# Patient Record
Sex: Male | Born: 1970 | Marital: Married
Health system: Southern US, Community
[De-identification: ages and names within clinical notes are randomized; demographics above are authoritative.]

---

## 2017-10-23 ENCOUNTER — Other Ambulatory Visit: Payer: Self-pay | Admitting: Family Medicine

## 2017-10-23 DIAGNOSIS — K824 Cholesterolosis of gallbladder: Secondary | ICD-10-CM

## 2017-10-23 DIAGNOSIS — B182 Chronic viral hepatitis C: Secondary | ICD-10-CM

## 2017-10-30 ENCOUNTER — Ambulatory Visit
Admission: RE | Admit: 2017-10-30 | Discharge: 2017-10-30 | Disposition: A | Discharge status: Home / Self Care | Payer: 59 | Source: Ambulatory Visit | Attending: Family Medicine | Admitting: Family Medicine

## 2017-10-30 DIAGNOSIS — K824 Cholesterolosis of gallbladder: Secondary | ICD-10-CM

## 2017-10-30 DIAGNOSIS — B182 Chronic viral hepatitis C: Secondary | ICD-10-CM

## 2017-11-27 ENCOUNTER — Other Ambulatory Visit: Payer: Self-pay | Admitting: Gastroenterology

## 2017-11-27 DIAGNOSIS — B182 Chronic viral hepatitis C: Secondary | ICD-10-CM

## 2017-12-03 ENCOUNTER — Ambulatory Visit (HOSPITAL_COMMUNITY)
Admission: RE | Admit: 2017-12-03 | Discharge: 2017-12-03 | Disposition: A | Discharge status: Home / Self Care | Payer: 59 | Source: Ambulatory Visit | Attending: Gastroenterology | Admitting: Gastroenterology

## 2017-12-03 DIAGNOSIS — B182 Chronic viral hepatitis C: Secondary | ICD-10-CM | POA: Diagnosis present

## 2020-03-01 IMAGING — US US ABDOMEN COMPLETE
1 series · 13 of 25 positions shown · non-contrast
Comparison: None

CLINICAL DATA: Chronic hepatitis-C without hepatic coma

EXAM:
ABDOMEN ULTRASOUND COMPLETE

[Series 1: us abdomen complete · 0.12mm/px · 13 of 96 slices shown]
[im 1/96]
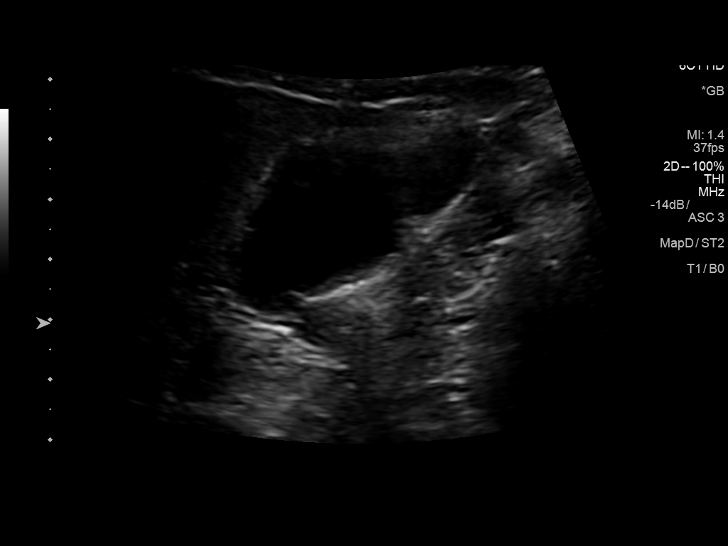
[im 8/96]
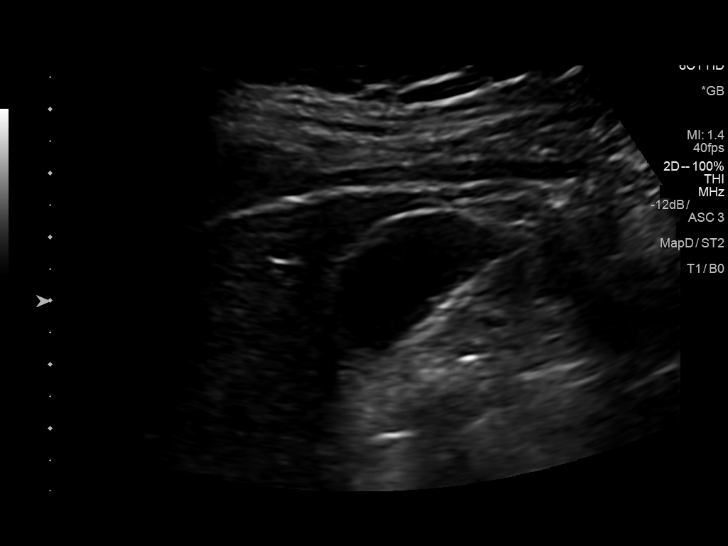
[im 16/96]
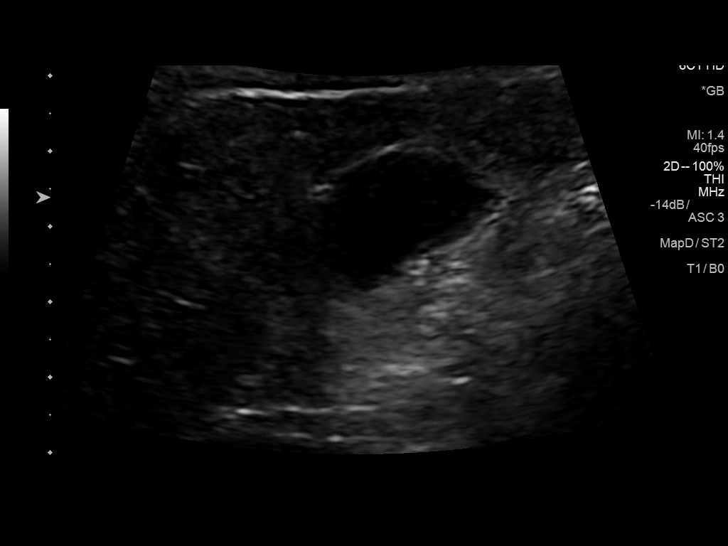
[im 24/96]
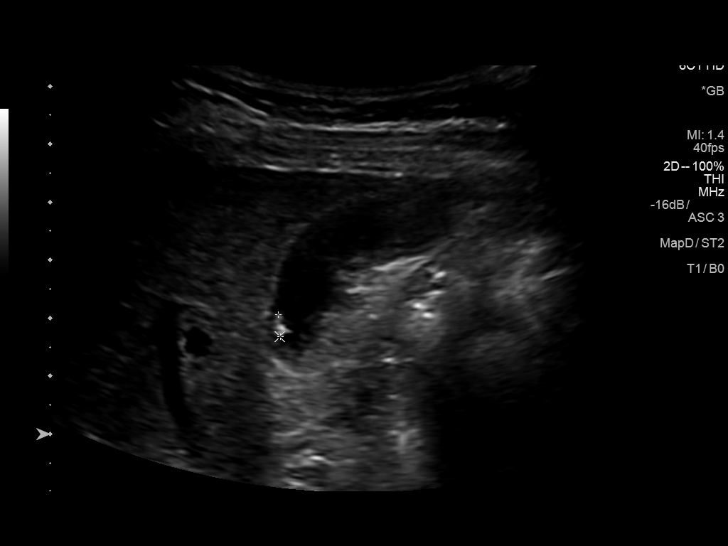
[im 32/96]
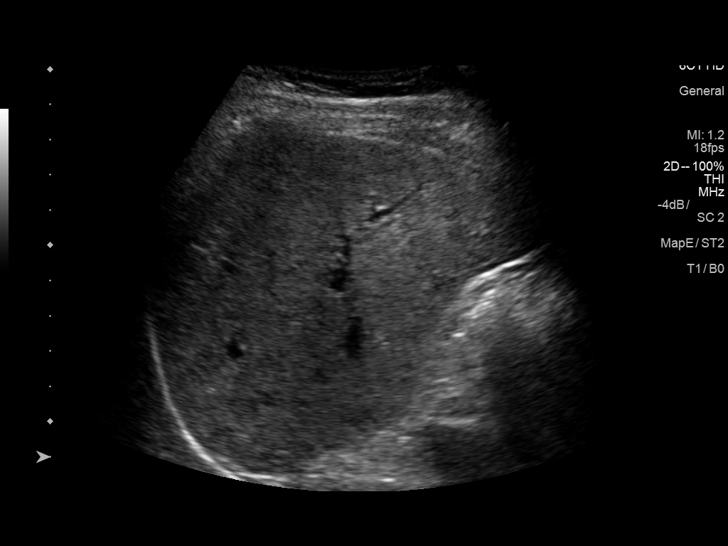
[im 40/96]
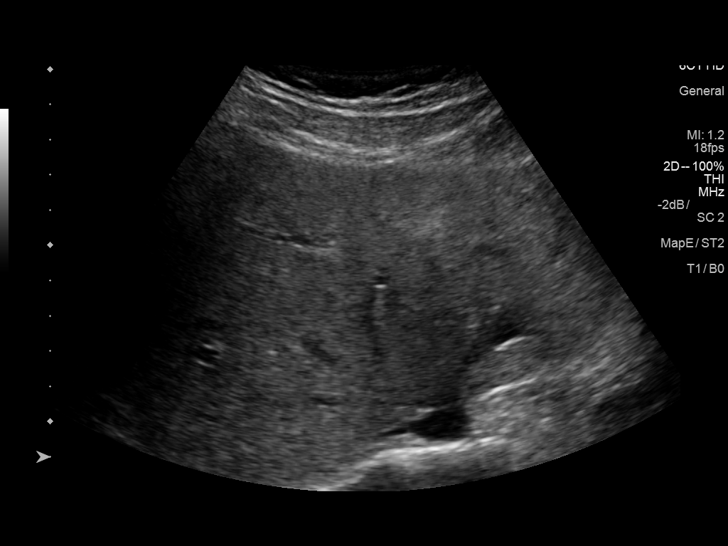
[im 48/96]
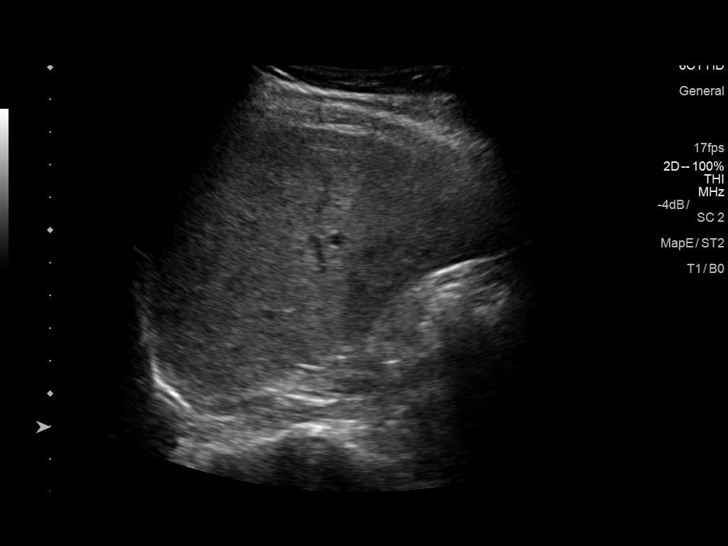
[im 56/96]
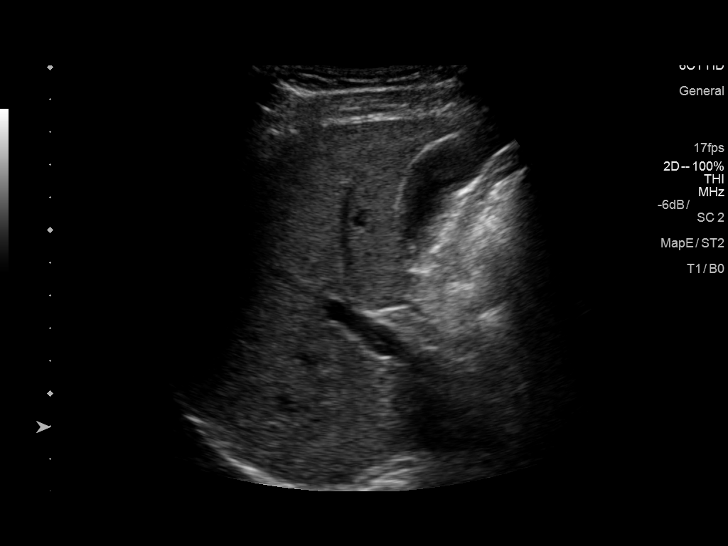
[im 64/96]
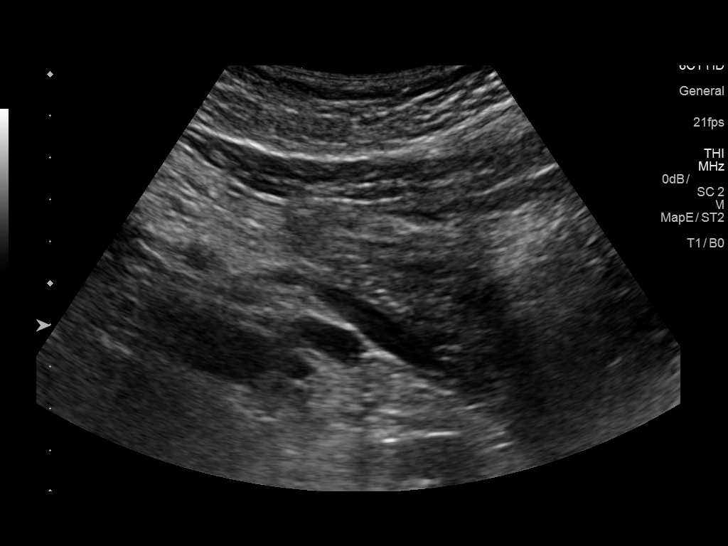
[im 72/96]
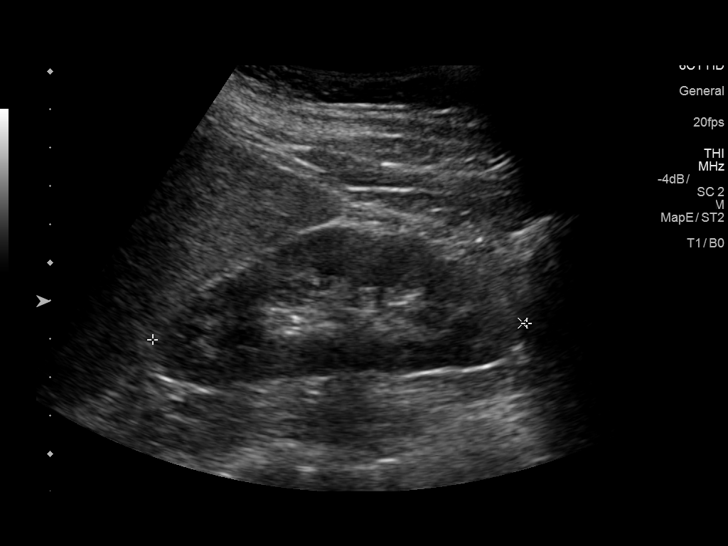
[im 80/96]
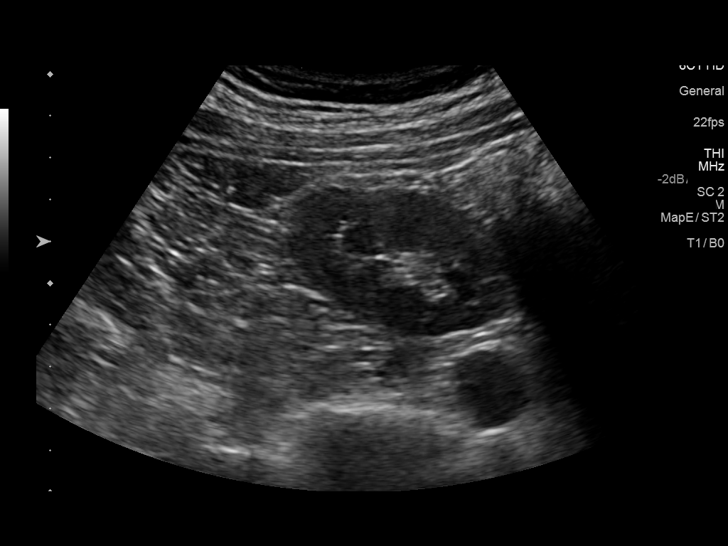
[im 88/96]
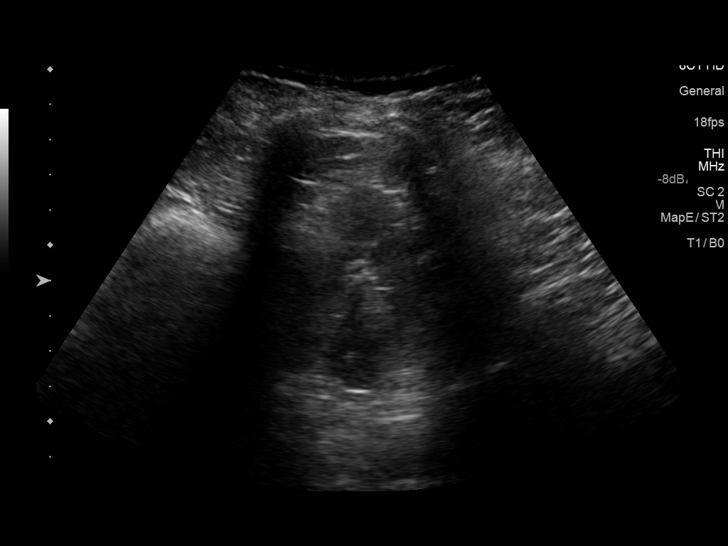
[im 96/96]
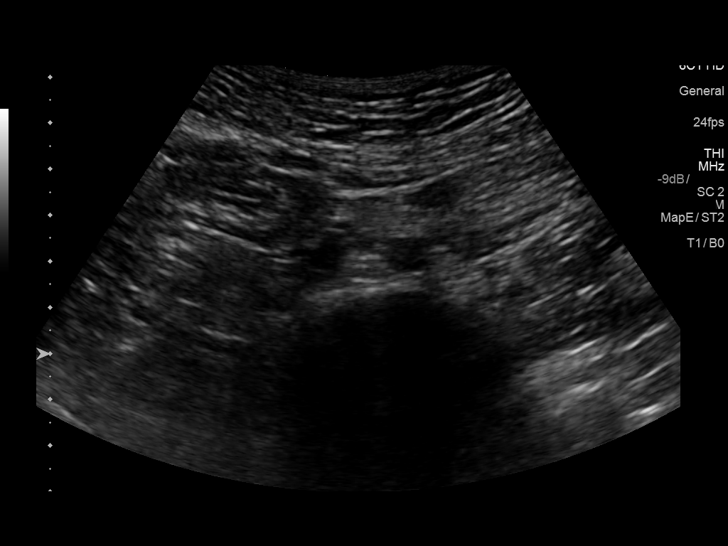

[13 of 25 positions shown; findings below may reference images not displayed]

FINDINGS: Gallbladder: 7 mm non mobile echogenic focus within gallbladder
lumen at lower gallbladder segment question polyp. Additional
questionable polyp 4 mm diameter. No gallbladder wall thickening,
pericholecystic fluid or sonographic Murphy sign.

Common bile duct: Diameter: Normal caliber 4 mm diameter

Liver: Echogenic parenchyma, likely fatty infiltration though this
can be seen with cirrhosis and certain infiltrative disorders. No
focal hepatic mass or nodularity. Portal vein patent on color
Doppler imaging with normal direction of blood flow towards the
liver.

IVC: Normal appearance

Pancreas: Normal appearance

Spleen: Normal appearance, 3.9 cm length

Right Kidney: Length: 9.8 cm.  Normal morphology without mass

Left Kidney: Length: 9.9 cm.  Normal morphology without mass

Abdominal aorta: Normal caliber

Other findings: No free fluid
IMPRESSION: Suspect mild fatty infiltration of liver as above.

Two gallbladder polyps larger 7 mm diameter; follow-up imaging
recommended in 1 year to assess stability; this recommendation
follows ACR consensus guidelines: White Paper of the ACR Incidental
Findings Committee II on Gallbladder and Biliary Findings. [HOSPITAL] [id number]:;[DATE].

## 2020-06-22 ENCOUNTER — Other Ambulatory Visit (HOSPITAL_COMMUNITY): Payer: Self-pay | Admitting: Family Medicine

## 2020-06-22 ENCOUNTER — Other Ambulatory Visit: Payer: Self-pay | Admitting: Family Medicine

## 2020-06-22 ENCOUNTER — Ambulatory Visit (HOSPITAL_COMMUNITY)
Admission: RE | Admit: 2020-06-22 | Discharge: 2020-06-22 | Disposition: A | Discharge status: Home / Self Care | Payer: 59 | Source: Ambulatory Visit | Attending: Family Medicine | Admitting: Family Medicine

## 2020-06-22 ENCOUNTER — Other Ambulatory Visit: Payer: Self-pay

## 2020-06-22 DIAGNOSIS — M79605 Pain in left leg: Secondary | ICD-10-CM | POA: Diagnosis not present

## 2020-06-22 DIAGNOSIS — M7989 Other specified soft tissue disorders: Secondary | ICD-10-CM

## 2020-06-22 NOTE — Progress Notes (Signed)
Left lower extremity venous duplex has been completed. Preliminary results can be found in CV Proc through chart review.  Results were given to [facility] at Dr. Kennieth Francois office.  06/22/20 3:56 PM Olen Cordial RVT
